# Patient Record
Sex: Female | Born: 1965 | Race: Black or African American | Hispanic: No | Marital: Married | Smoking: Current every day smoker
Health system: Southern US, Community
[De-identification: ages and names within clinical notes are randomized; demographics above are authoritative.]

## PROBLEM LIST (undated history)

## (undated) DIAGNOSIS — G4730 Sleep apnea, unspecified: Secondary | ICD-10-CM

## (undated) DIAGNOSIS — E119 Type 2 diabetes mellitus without complications: Secondary | ICD-10-CM

## (undated) DIAGNOSIS — D219 Benign neoplasm of connective and other soft tissue, unspecified: Secondary | ICD-10-CM

## (undated) DIAGNOSIS — E7800 Pure hypercholesterolemia, unspecified: Secondary | ICD-10-CM

## (undated) DIAGNOSIS — I10 Essential (primary) hypertension: Secondary | ICD-10-CM

## (undated) HISTORY — PX: UTERINE FIBROID SURGERY: SHX826

---

## 2012-08-22 ENCOUNTER — Emergency Department (HOSPITAL_BASED_OUTPATIENT_CLINIC_OR_DEPARTMENT_OTHER): Payer: Medicaid Other

## 2012-08-22 ENCOUNTER — Emergency Department (HOSPITAL_BASED_OUTPATIENT_CLINIC_OR_DEPARTMENT_OTHER)
Admission: EM | Admit: 2012-08-22 | Discharge: 2012-08-23 | Disposition: A | Discharge status: Home / Self Care | Payer: Medicaid Other | Attending: Emergency Medicine | Admitting: Emergency Medicine

## 2012-08-22 ENCOUNTER — Encounter (HOSPITAL_BASED_OUTPATIENT_CLINIC_OR_DEPARTMENT_OTHER): Payer: Self-pay | Admitting: *Deleted

## 2012-08-22 DIAGNOSIS — E119 Type 2 diabetes mellitus without complications: Secondary | ICD-10-CM | POA: Insufficient documentation

## 2012-08-22 DIAGNOSIS — I10 Essential (primary) hypertension: Secondary | ICD-10-CM | POA: Insufficient documentation

## 2012-08-22 DIAGNOSIS — D219 Benign neoplasm of connective and other soft tissue, unspecified: Secondary | ICD-10-CM

## 2012-08-22 DIAGNOSIS — Z79899 Other long term (current) drug therapy: Secondary | ICD-10-CM | POA: Insufficient documentation

## 2012-08-22 DIAGNOSIS — R300 Dysuria: Secondary | ICD-10-CM | POA: Insufficient documentation

## 2012-08-22 DIAGNOSIS — R350 Frequency of micturition: Secondary | ICD-10-CM | POA: Insufficient documentation

## 2012-08-22 DIAGNOSIS — F17200 Nicotine dependence, unspecified, uncomplicated: Secondary | ICD-10-CM | POA: Insufficient documentation

## 2012-08-22 DIAGNOSIS — Z862 Personal history of diseases of the blood and blood-forming organs and certain disorders involving the immune mechanism: Secondary | ICD-10-CM | POA: Insufficient documentation

## 2012-08-22 DIAGNOSIS — N390 Urinary tract infection, site not specified: Secondary | ICD-10-CM | POA: Insufficient documentation

## 2012-08-22 DIAGNOSIS — R109 Unspecified abdominal pain: Secondary | ICD-10-CM

## 2012-08-22 DIAGNOSIS — Z8639 Personal history of other endocrine, nutritional and metabolic disease: Secondary | ICD-10-CM | POA: Insufficient documentation

## 2012-08-22 DIAGNOSIS — D259 Leiomyoma of uterus, unspecified: Secondary | ICD-10-CM | POA: Insufficient documentation

## 2012-08-22 DIAGNOSIS — Z3202 Encounter for pregnancy test, result negative: Secondary | ICD-10-CM | POA: Insufficient documentation

## 2012-08-22 HISTORY — DX: Essential (primary) hypertension: I10

## 2012-08-22 HISTORY — DX: Type 2 diabetes mellitus without complications: E11.9

## 2012-08-22 HISTORY — DX: Pure hypercholesterolemia, unspecified: E78.00

## 2012-08-22 HISTORY — DX: Benign neoplasm of connective and other soft tissue, unspecified: D21.9

## 2012-08-22 HISTORY — DX: Sleep apnea, unspecified: G47.30

## 2012-08-22 LAB — COMPREHENSIVE METABOLIC PANEL
ALT: 10 U/L (ref 0–35)
AST: 12 U/L (ref 0–37)
Albumin: 4.2 g/dL (ref 3.5–5.2)
Alkaline Phosphatase: 63 U/L (ref 39–117)
BUN: 11 mg/dL (ref 6–23)
CO2: 27 mEq/L (ref 19–32)
Calcium: 9.8 mg/dL (ref 8.4–10.5)
Chloride: 98 mEq/L (ref 96–112)
Creatinine, Ser: 0.8 mg/dL (ref 0.50–1.10)
GFR calc Af Amer: >90 mL/min (ref >90)
GFR calc non Af Amer: 88 mL/min — ABNORMAL LOW (ref >90)
Glucose, Bld: 114 mg/dL — ABNORMAL HIGH (ref 70–99)
Potassium: 3.3 mEq/L — ABNORMAL LOW (ref 3.5–5.1)
Sodium: 138 mEq/L (ref 135–145)
Total Bilirubin: 0.3 mg/dL (ref 0.3–1.2)
Total Protein: 7.7 g/dL (ref 6.0–8.3)

## 2012-08-22 LAB — URINALYSIS, ROUTINE W REFLEX MICROSCOPIC
Ketones, ur: NEGATIVE mg/dL
Nitrite: NEGATIVE
Urobilinogen, UA: 1 mg/dL (ref 0.0–1.0)

## 2012-08-22 LAB — CBC WITH DIFFERENTIAL/PLATELET
Basophils Absolute: 0 10*3/uL (ref 0.0–0.1)
Basophils Relative: 0 % (ref 0–1)
Eosinophils Absolute: 0.2 10*3/uL (ref 0.0–0.7)
Eosinophils Relative: 2 % (ref 0–5)
HCT: 42.4 % (ref 36.0–46.0)
Hemoglobin: 14.4 g/dL (ref 12.0–15.0)
Lymphocytes Relative: 52 % — ABNORMAL HIGH (ref 12–46)
Lymphs Abs: 4.1 10*3/uL — ABNORMAL HIGH (ref 0.7–4.0)
MCH: 29.8 pg (ref 26.0–34.0)
MCHC: 34 g/dL (ref 30.0–36.0)
MCV: 87.6 fL (ref 78.0–100.0)
Monocytes Absolute: 0.5 10*3/uL (ref 0.1–1.0)
Monocytes Relative: 7 % (ref 3–12)
Neutro Abs: 3 10*3/uL (ref 1.7–7.7)
Neutrophils Relative %: 39 % — ABNORMAL LOW (ref 43–77)
Platelets: 331 10*3/uL (ref 150–400)
RBC: 4.84 MIL/uL (ref 3.87–5.11)
RDW: 15.2 % (ref 11.5–15.5)
WBC: 7.8 10*3/uL (ref 4.0–10.5)

## 2012-08-22 LAB — PREGNANCY, URINE: Preg Test, Ur: NEGATIVE

## 2012-08-22 MED ORDER — ONDANSETRON HCL 4 MG/2ML IJ SOLN
4.0000 mg | Freq: Once | INTRAMUSCULAR | Status: AC
  Administered 2012-08-22: 4 mg via INTRAVENOUS
  Filled 2012-08-22: qty 2

## 2012-08-22 MED ORDER — IOHEXOL 300 MG/ML  SOLN
50.0000 mL | Freq: Once | INTRAMUSCULAR | Status: AC | PRN
  Administered 2012-08-22: 50 mL via ORAL

## 2012-08-22 MED ORDER — HYDROMORPHONE HCL PF 1 MG/ML IJ SOLN
1.0000 mg | Freq: Once | INTRAMUSCULAR | Status: AC
  Administered 2012-08-22: 1 mg via INTRAVENOUS
  Filled 2012-08-22: qty 1

## 2012-08-22 MED ORDER — CEPHALEXIN 500 MG PO CAPS: 500.0000 mg | ORAL_CAPSULE | Freq: Four times a day (QID) | ORAL | Status: AC

## 2012-08-22 MED ORDER — IOHEXOL 300 MG/ML  SOLN
150.0000 mL | Freq: Once | INTRAMUSCULAR | Status: AC | PRN
  Administered 2012-08-22: 150 mL via INTRAVENOUS

## 2012-08-22 MED ORDER — HYDROCODONE-ACETAMINOPHEN 5-325 MG PO TABS: 1.0000 | ORAL_TABLET | Freq: Four times a day (QID) | ORAL | Status: AC | PRN

## 2012-08-22 NOTE — ED Notes (Signed)
C/o low abd pain for couple weeks- reports urinary frequency

## 2012-08-22 NOTE — ED Notes (Signed)
rx x 2 given for hydrocodone and keflex- pt has a ride

## 2012-08-22 NOTE — ED Provider Notes (Signed)
History     CSN: [id number]  Arrival date & time 08/22/12  1527   First MD Initiated Contact with Patient 08/22/12 1844      Chief Complaint  Patient presents with  . Abdominal Pain    (Consider location/radiation/quality/duration/timing/severity/associated sxs/prior treatment) HPI Patient presents emergency department with lower abdominal pain, that began 2 weeks ago.  Patient, states symptoms have gotten worse over that time frame.  Patient denies nausea, vomiting, fever, or headache, visual changes, chest pain, shortness of breath, back pain, or syncope.  Patient, states, that she's had some urinary frequency and discomfort with urination.  Patient denies vaginal bleeding or vaginal discharge.  Past Medical History  Diagnosis Date  . Diabetes mellitus without complication   . Hypertension   . Sleep apnea   . High cholesterol   . Fibroids     Past Surgical History  Procedure Laterality Date  . Uterine fibroid surgery      No family history on file.  History  Substance Use Topics  . Smoking status: Current Every Day Smoker  . Smokeless tobacco: Never Used  . Alcohol Use: No    OB History   Grav Para Term Preterm Abortions TAB SAB Ect Mult Living                  Review of Systems All other systems negative except as documented in the HPI. All pertinent positives and negatives as reviewed in the HPI. Allergies  Review of patient's allergies indicates no known allergies.  Home Medications   Current Outpatient Rx  Name  Route  Sig  Dispense  Refill  . amLODipine (NORVASC) 5 MG tablet   Oral   Take 5 mg by mouth daily.         . Exenatide (BYDUREON [place])   Subcutaneous   Inject into the skin.         . hydrochlorothiazide (HYDRODIURIL) 25 MG tablet   Oral   Take 25 mg by mouth daily.         Marland Kitchen lisinopril (PRINIVIL,ZESTRIL) 10 MG tablet   Oral   Take 10 mg by mouth daily.         . metFORMIN (GLUCOPHAGE) 500 MG tablet   Oral   Take 500 mg by  mouth 2 (two) times daily with a meal.           BP 151/78  Pulse 63  Temp(Src) 98.4 F (36.9 C)  Resp 20  SpO2 99%  LMP 08/15/2012  Physical Exam  Nursing note and vitals reviewed. Constitutional: She is oriented to person, place, and time. She appears well-developed and well-nourished. No distress.  HENT:  Head: Normocephalic and atraumatic.  Mouth/Throat: Oropharynx is clear and moist.  Eyes: Pupils are equal, round, and reactive to light.  Neck: Normal range of motion. Neck supple.  Cardiovascular: Normal rate, regular rhythm and normal heart sounds.   Pulmonary/Chest: Effort normal and breath sounds normal.  Abdominal: Soft. Bowel sounds are normal. She exhibits no distension. There is tenderness. There is no rigidity, no rebound, no guarding and negative Murphy's sign.    Genitourinary: Vagina normal. No vaginal discharge found.  Neurological: She is alert and oriented to person, place, and time.  Skin: Skin is warm and dry. No rash noted.    ED Course  Procedures (including critical care time)  Labs Reviewed  URINALYSIS, ROUTINE W REFLEX MICROSCOPIC - Abnormal; Notable for the following:    Color, Urine AMBER (*)  APPearance CLOUDY (*)    Hgb urine dipstick TRACE (*)    Bilirubin Urine SMALL (*)    Leukocytes, UA SMALL (*)    All other components within normal limits  URINE MICROSCOPIC-ADD ON - Abnormal; Notable for the following:    Squamous Epithelial / LPF MANY (*)    Bacteria, UA MANY (*)    All other components within normal limits  CBC WITH DIFFERENTIAL - Abnormal; Notable for the following:    Neutrophils Relative 39 (*)    Lymphocytes Relative 52 (*)    Lymphs Abs 4.1 (*)    All other components within normal limits  COMPREHENSIVE METABOLIC PANEL - Abnormal; Notable for the following:    Potassium 3.3 (*)    Glucose, Bld 114 (*)    GFR calc non Af Amer 88 (*)    All other components within normal limits  URINE CULTURE  PREGNANCY, URINE   Ct  Abdomen Pelvis W Contrast  08/22/2012  *RADIOLOGY REPORT*  Clinical Data: Right lower quadrant abdominal pain for 2 weeks; constipation.  CT ABDOMEN AND PELVIS WITH CONTRAST  Technique:  Multidetector CT imaging of the abdomen and pelvis was performed following the standard protocol during bolus administration of intravenous contrast.  Contrast: 150 mL of Omnipaque 300 IV contrast  Comparison: None.  Findings: The visualized lung bases are clear.  The liver and spleen are unremarkable in appearance.  The gallbladder is unremarkable; slightly increased attenuation within the gallbladder is nonspecific and may remain within normal limits. The pancreas and adrenal glands are unremarkable.  The kidneys are unremarkable in appearance.  There is no evidence of hydronephrosis.  No renal or ureteral stones are seen.  No perinephric stranding is appreciated.  No free fluid is identified.  The small bowel is unremarkable in appearance.  The stomach is within normal limits.  No acute vascular abnormalities are seen.  The appendix is normal in caliber, without evidence for appendicitis.  The colon is unremarkable in appearance, with a small amount of stool along the ascending and transverse colon.  The bladder is mildly distended and grossly unremarkable. Peripherally calcified fibroids are seen within the uterus, measuring up to 6.6 cm in size.  The ovaries are relatively symmetric; no suspicious adnexal masses are seen.  No inguinal lymphadenopathy is seen.  No acute osseous abnormalities are identified.  IMPRESSION:  1.  No acute abnormalities seen in the abdomen or pelvis. No CT findings to suggest significant constipation. 2.  Enlarged uterus, with several large peripherally calcified fibroids, measuring up to 6.6 cm in size.   Original Report Authenticated By: Tonia Ghent, M.D.      Patient be treated for UTI, based on her physical exam findings, along with her history of present illness.  CT scan does not show any  significant abnormality.  The patient has a history of fibroids of the uterus.  This could also contribute to her pain as well   MDM  MDM Reviewed: vitals and nursing note Interpretation: labs and CT scan   The patient is referred back to her primary care Dr. for further evaluation and recheck.         Carlyle Dolly, PA-C 08/22/12 2328

## 2012-08-22 NOTE — ED Notes (Signed)
Pt returned from CT °

## 2012-08-22 NOTE — ED Notes (Signed)
Pt requesting pain medication. Will update PA.

## 2012-08-23 NOTE — ED Provider Notes (Signed)
I personally performed the services described in this documentation, which was scribed in my presence. The recorded information has been reviewed and considered.   Hilario Quarry, MD 08/23/12 [phone]

## 2012-08-24 LAB — URINE CULTURE: Culture: NO GROWTH

## 2013-02-17 ENCOUNTER — Encounter: Payer: Self-pay | Admitting: Internal Medicine

## 2013-04-14 NOTE — Progress Notes (Signed)
This encounter was created in error - please disregard.

## 2013-07-16 ENCOUNTER — Encounter (HOSPITAL_BASED_OUTPATIENT_CLINIC_OR_DEPARTMENT_OTHER): Payer: Self-pay | Admitting: Emergency Medicine

## 2013-07-16 ENCOUNTER — Emergency Department (HOSPITAL_BASED_OUTPATIENT_CLINIC_OR_DEPARTMENT_OTHER)
Admission: EM | Admit: 2013-07-16 | Discharge: 2013-07-16 | Disposition: A | Discharge status: Home / Self Care | Payer: Medicaid Other | Attending: Emergency Medicine | Admitting: Emergency Medicine

## 2013-07-16 ENCOUNTER — Emergency Department (HOSPITAL_BASED_OUTPATIENT_CLINIC_OR_DEPARTMENT_OTHER): Payer: Medicaid Other

## 2013-07-16 DIAGNOSIS — Z9889 Other specified postprocedural states: Secondary | ICD-10-CM | POA: Insufficient documentation

## 2013-07-16 DIAGNOSIS — Z8669 Personal history of other diseases of the nervous system and sense organs: Secondary | ICD-10-CM | POA: Insufficient documentation

## 2013-07-16 DIAGNOSIS — Z79899 Other long term (current) drug therapy: Secondary | ICD-10-CM | POA: Insufficient documentation

## 2013-07-16 DIAGNOSIS — Z792 Long term (current) use of antibiotics: Secondary | ICD-10-CM | POA: Insufficient documentation

## 2013-07-16 DIAGNOSIS — E119 Type 2 diabetes mellitus without complications: Secondary | ICD-10-CM | POA: Insufficient documentation

## 2013-07-16 DIAGNOSIS — F17200 Nicotine dependence, unspecified, uncomplicated: Secondary | ICD-10-CM | POA: Insufficient documentation

## 2013-07-16 DIAGNOSIS — D259 Leiomyoma of uterus, unspecified: Secondary | ICD-10-CM | POA: Insufficient documentation

## 2013-07-16 DIAGNOSIS — Z3202 Encounter for pregnancy test, result negative: Secondary | ICD-10-CM | POA: Insufficient documentation

## 2013-07-16 DIAGNOSIS — I10 Essential (primary) hypertension: Secondary | ICD-10-CM | POA: Insufficient documentation

## 2013-07-16 LAB — URINE MICROSCOPIC-ADD ON

## 2013-07-16 LAB — COMPREHENSIVE METABOLIC PANEL
ALBUMIN: 3.9 g/dL (ref 3.5–5.2)
ALK PHOS: 73 U/L (ref 39–117)
ALT: 13 U/L (ref 0–35)
AST: 10 U/L (ref 0–37)
BILIRUBIN TOTAL: 0.3 mg/dL (ref 0.3–1.2)
BUN: 8 mg/dL (ref 6–23)
CHLORIDE: 98 meq/L (ref 96–112)
CO2: 23 mEq/L (ref 19–32)
Calcium: 9.1 mg/dL (ref 8.4–10.5)
Creatinine, Ser: 0.7 mg/dL (ref 0.50–1.10)
GFR calc Af Amer: >90 mL/min (ref >90)
GFR calc non Af Amer: >90 mL/min (ref >90)
Glucose, Bld: 210 mg/dL — ABNORMAL HIGH (ref 70–99)
POTASSIUM: 3.7 meq/L (ref 3.7–5.3)
SODIUM: 136 meq/L — AB (ref 137–147)
Total Protein: 7.2 g/dL (ref 6.0–8.3)

## 2013-07-16 LAB — CBC WITH DIFFERENTIAL/PLATELET
BASOS PCT: 0 % (ref 0–1)
Basophils Absolute: 0 10*3/uL (ref 0.0–0.1)
Eosinophils Absolute: 0.1 10*3/uL (ref 0.0–0.7)
Eosinophils Relative: 1 % (ref 0–5)
HCT: 39.7 % (ref 36.0–46.0)
HEMOGLOBIN: 13.2 g/dL (ref 12.0–15.0)
LYMPHS ABS: 2.4 10*3/uL (ref 0.7–4.0)
Lymphocytes Relative: 23 % (ref 12–46)
MCH: 29.4 pg (ref 26.0–34.0)
MCHC: 33.2 g/dL (ref 30.0–36.0)
MCV: 88.4 fL (ref 78.0–100.0)
MONOS PCT: 6 % (ref 3–12)
Monocytes Absolute: 0.7 10*3/uL (ref 0.1–1.0)
NEUTROS ABS: 7.2 10*3/uL (ref 1.7–7.7)
NEUTROS PCT: 69 % (ref 43–77)
Platelets: 333 10*3/uL (ref 150–400)
RBC: 4.49 MIL/uL (ref 3.87–5.11)
RDW: 14.6 % (ref 11.5–15.5)
WBC: 10.4 10*3/uL (ref 4.0–10.5)

## 2013-07-16 LAB — URINALYSIS, ROUTINE W REFLEX MICROSCOPIC
Glucose, UA: 250 mg/dL — AB
KETONES UR: 15 mg/dL — AB
NITRITE: POSITIVE — AB
Protein, ur: NEGATIVE mg/dL
Specific Gravity, Urine: 1.029 (ref 1.005–1.030)
UROBILINOGEN UA: 1 mg/dL (ref 0.0–1.0)
pH: 5.5 (ref 5.0–8.0)

## 2013-07-16 LAB — GC/CHLAMYDIA PROBE AMP
CT PROBE, AMP APTIMA: NEGATIVE
GC PROBE AMP APTIMA: NEGATIVE

## 2013-07-16 LAB — WET PREP, GENITAL
Clue Cells Wet Prep HPF POC: NONE SEEN
Trich, Wet Prep: NONE SEEN
Yeast Wet Prep HPF POC: NONE SEEN

## 2013-07-16 LAB — PREGNANCY, URINE: Preg Test, Ur: NEGATIVE

## 2013-07-16 MED ORDER — IOHEXOL 300 MG/ML  SOLN
50.0000 mL | Freq: Once | INTRAMUSCULAR | Status: AC | PRN
  Administered 2013-07-16: 50 mL via ORAL

## 2013-07-16 MED ORDER — FENTANYL CITRATE 0.05 MG/ML IJ SOLN
100.0000 ug | Freq: Once | INTRAMUSCULAR | Status: AC
  Administered 2013-07-16: 100 ug via INTRAVENOUS
  Filled 2013-07-16: qty 2

## 2013-07-16 MED ORDER — SODIUM CHLORIDE 0.9 % IV SOLN
INTRAVENOUS | Status: DC
  Administered 2013-07-16: 04:00:00 via INTRAVENOUS

## 2013-07-16 MED ORDER — OXYCODONE-ACETAMINOPHEN 5-325 MG PO TABS: ORAL_TABLET | ORAL | Status: AC

## 2013-07-16 NOTE — ED Notes (Signed)
Patient transported to CT 

## 2013-07-16 NOTE — ED Notes (Signed)
C/o pelvic pain since Tuesday, denies vaginal bleeding or fever. Pt has hx of fibroids in the past.

## 2013-07-16 NOTE — ED Notes (Signed)
MD at bedside. 

## 2013-07-16 NOTE — ED Notes (Signed)
Pt c/o increased pain, MD made aware and new orders rec'd.

## 2013-07-16 NOTE — ED Provider Notes (Addendum)
CSN: [id number]     Arrival date & time 07/16/13  [id number] History   First MD Initiated Contact with Patient 07/16/13 0350     Chief Complaint  Patient presents with  . Pelvic Pain   (Consider location/radiation/quality/duration/timing/severity/associated sxs/prior Treatment) HPI This is a 48 year old female with a history of uterine fibroids. She is here with right lower quadrant abdominal pain for the past 4 days but acutely worsened this morning. She describes the pain is sharp. It is moderate to severe. It is worse with movement or palpation. She has not noticed any associated vaginal bleeding, vaginal discharge, dysuria or hematuria. She has not been nauseated, vomiting or having diarrhea. She has been afebrile. The pain is reminiscent of previous episodes of pain from her uterine fibroids.  Past Medical History  Diagnosis Date  . Diabetes mellitus without complication   . Hypertension   . Sleep apnea   . High cholesterol   . Fibroids    Past Surgical History  Procedure Laterality Date  . Uterine fibroid surgery     History reviewed. No pertinent family history. History  Substance Use Topics  . Smoking status: Current Every Day Smoker  . Smokeless tobacco: Never Used  . Alcohol Use: No   OB History   Grav Para Term Preterm Abortions TAB SAB Ect Mult Living                 Review of Systems  All other systems reviewed and are negative.    Allergies  Review of patient's allergies indicates no known allergies.  Home Medications   Current Outpatient Rx  Name  Route  Sig  Dispense  Refill  . amLODipine (NORVASC) 5 MG tablet   Oral   Take 5 mg by mouth daily.         . hydrochlorothiazide (HYDRODIURIL) 25 MG tablet   Oral   Take 25 mg by mouth daily.         Marland Kitchen HYDROcodone-acetaminophen (NORCO/VICODIN) 5-325 MG per tablet   Oral   Take 1 tablet by mouth every 6 (six) hours as needed for pain.   15 tablet   0   . lisinopril (PRINIVIL,ZESTRIL) 10 MG tablet    Oral   Take 10 mg by mouth daily.         . metFORMIN (GLUCOPHAGE) 500 MG tablet   Oral   Take 500 mg by mouth 2 (two) times daily with a meal.         . cephALEXin (KEFLEX) 500 MG capsule   Oral   Take 1 capsule (500 mg total) by mouth 4 (four) times daily.   28 capsule   0   . Exenatide (BYDUREON [place])   Subcutaneous   Inject into the skin.          BP 149/93  Pulse 79  Temp(Src) 98.4 F (36.9 C) (Oral)  Resp 18  Ht 5\' 4"  (1.626 m)  Wt 358 lb (162.388 kg)  BMI 61.42 kg/m2  SpO2 100%  LMP 07/01/2013  Physical Exam General: Well-developed, obese female in no acute distress; appearance consistent with age of record HENT: normocephalic; atraumatic Eyes: pupils equal, round and reactive to light; extraocular muscles intact Neck: supple Heart: regular rate and rhythm Lungs: clear to auscultation bilaterally Abdomen: soft; obese; right lower quadrant tenderness; bowel sounds present GU: Normal external genitalia; white vaginal discharge; no vaginal bleeding; no cervical inflammation; uterus enlarged; no cervical motion tenderness; no adnexal tenderness but examination limited by obesity Extremities:  No deformity; full range of motion Neurologic: Awake, alert and oriented; motor function intact in all extremities and symmetric; no facial droop Skin: Warm and dry Psychiatric: Normal mood and affect    ED Course  Procedures (including critical care time)    MDM   Nursing notes and vitals signs, including pulse oximetry, reviewed.  Summary of this visit's results, reviewed by myself:  Labs:  Results for orders placed during the hospital encounter of 07/16/13 (from the past 24 hour(s))  URINALYSIS, ROUTINE W REFLEX MICROSCOPIC     Status: Abnormal   Collection Time    07/16/13  3:45 AM      Result Value Range   Color, Urine ORANGE (*) YELLOW   APPearance CLOUDY (*) CLEAR   Specific Gravity, Urine 1.029  1.005 - 1.030   pH 5.5  5.0 - 8.0   Glucose, UA 250  (*) NEGATIVE mg/dL   Hgb urine dipstick SMALL (*) NEGATIVE   Bilirubin Urine SMALL (*) NEGATIVE   Ketones, ur 15 (*) NEGATIVE mg/dL   Protein, ur NEGATIVE  NEGATIVE mg/dL   Urobilinogen, UA 1.0  0.0 - 1.0 mg/dL   Nitrite POSITIVE (*) NEGATIVE   Leukocytes, UA MODERATE (*) NEGATIVE  PREGNANCY, URINE     Status: None   Collection Time    07/16/13  3:45 AM      Result Value Range   Preg Test, Ur NEGATIVE  NEGATIVE  URINE MICROSCOPIC-ADD ON     Status: Abnormal   Collection Time    07/16/13  3:45 AM      Result Value Range   Squamous Epithelial / LPF RARE  RARE   WBC, UA 3-6  <3 WBC/hpf   RBC / HPF 3-6  <3 RBC/hpf   Bacteria, UA FEW (*) RARE   Urine-Other MUCOUS PRESENT    WET PREP, GENITAL     Status: Abnormal   Collection Time    07/16/13  4:00 AM      Result Value Range   Yeast Wet Prep HPF POC NONE SEEN  NONE SEEN   Trich, Wet Prep NONE SEEN  NONE SEEN   Clue Cells Wet Prep HPF POC NONE SEEN  NONE SEEN   WBC, Wet Prep HPF POC FEW (*) NONE SEEN  CBC WITH DIFFERENTIAL     Status: None   Collection Time    07/16/13  4:10 AM      Result Value Range   WBC 10.4  4.0 - 10.5 K/uL   RBC 4.49  3.87 - 5.11 MIL/uL   Hemoglobin 13.2  12.0 - 15.0 g/dL   HCT 39.7  36.0 - 46.0 %   MCV 88.4  78.0 - 100.0 fL   MCH 29.4  26.0 - 34.0 pg   MCHC 33.2  30.0 - 36.0 g/dL   RDW 14.6  11.5 - 15.5 %   Platelets 333  150 - 400 K/uL   Neutrophils Relative % 69  43 - 77 %   Neutro Abs 7.2  1.7 - 7.7 K/uL   Lymphocytes Relative 23  12 - 46 %   Lymphs Abs 2.4  0.7 - 4.0 K/uL   Monocytes Relative 6  3 - 12 %   Monocytes Absolute 0.7  0.1 - 1.0 K/uL   Eosinophils Relative 1  0 - 5 %   Eosinophils Absolute 0.1  0.0 - 0.7 K/uL   Basophils Relative 0  0 - 1 %   Basophils Absolute 0.0  0.0 - 0.1 K/uL  COMPREHENSIVE METABOLIC PANEL     Status: Abnormal   Collection Time    07/16/13  4:10 AM      Result Value Range   Sodium 136 (*) 137 - 147 mEq/L   Potassium 3.7  3.7 - 5.3 mEq/L   Chloride 98  96  - 112 mEq/L   CO2 23  19 - 32 mEq/L   Glucose, Bld 210 (*) 70 - 99 mg/dL   BUN 8  6 - 23 mg/dL   Creatinine, Ser 0.70  0.50 - 1.10 mg/dL   Calcium 9.1  8.4 - 10.5 mg/dL   Total Protein 7.2  6.0 - 8.3 g/dL   Albumin 3.9  3.5 - 5.2 g/dL   AST 10  0 - 37 U/L   ALT 13  0 - 35 U/L   Alkaline Phosphatase 73  39 - 117 U/L   Total Bilirubin 0.3  0.3 - 1.2 mg/dL   GFR calc non Af Amer >90  >90 mL/min   GFR calc Af Amer >90  >90 mL/min    Imaging Studies: Ct Abdomen Pelvis Wo Contrast  07/16/2013   CLINICAL DATA:  Right lower quadrant pain, pelvic pain.  EXAM: CT ABDOMEN AND PELVIS WITHOUT CONTRAST  TECHNIQUE: Multidetector CT imaging of the abdomen and pelvis was performed following the standard protocol without intravenous contrast.  COMPARISON:  Prior CT from 08/22/2012  FINDINGS: The visualized lung bases are clear.  The liver is within normal limits. Gallbladder is unremarkable. No biliary ductal dilatation. The spleen, adrenal glands, and pancreas demonstrate a normal unenhanced appearance.  The kidneys are symmetric in size without evidence nephrolithiasis or hydronephrosis. No stones seen along the course of either renal collecting system. No hydroureter.  There is no evidence of bowel obstruction. Appendix is well visualized in the right lower quadrant and is of normal caliber and appearance without associated inflammatory changes to suggest acute appendicitis. No inflammatory fat stranding or abnormal wall thickening seen about the bowels. Small fat containing paraumbilical hernia noted.  The bladder is within normal limits.  The uterus is enlarged with multiple peripherally calcified fibroids again seen. Again, the largest of these measures approximately 6.8 cm and maximal diameter. Ovaries are within normal limits.  No free air or fluid. No enlarged intra-abdominal pelvic lymph nodes.  Osseous structures are within normal limits. No focal lytic or blastic osseous lesions identified.  IMPRESSION:  1. No CT evidence of acute intra-abdominal or pelvic process. Normal appendix. 2. Enlarged fibroid uterus, similar to prior exam.   Electronically Signed   By: Jeannine Boga M.D.   On: 07/16/2013 06:19   6:35 AM Suspect pain is related to patient's chronic fibroids. An infarcted fibroid could be causing ischemic pain. She does have an appointment 07/29/13 with a new OB/GYN, Dr. Joya Gaskins on [address] in [place]. She was advised to discuss the possibility of an elective hysterectomy at that time.     Wynetta Fines, MD 07/16/13 2751  Wynetta Fines, MD 07/16/13 [phone]

## 2013-07-17 LAB — URINE CULTURE

## 2015-03-24 IMAGING — CT CT ABD-PELV W/O CM
2 of 4 series · 17 of 46 positions shown, 19 images · non-contrast
Comparison: Prior CT from 08/22/2012

CLINICAL DATA: Right lower quadrant pain, pelvic pain.

EXAM:
CT ABDOMEN AND PELVIS WITHOUT CONTRAST
TECHNIQUE: Multidetector CT imaging of the abdomen and pelvis was performed
following the standard protocol without intravenous contrast.

[Series 2: abd/pelvis 5.0 b31f · axial · 0.98mm/px · z∈[+818,+1248]mm · 14 of 96 slices shown, 16 images]
[im 5/96  soft-tissue]
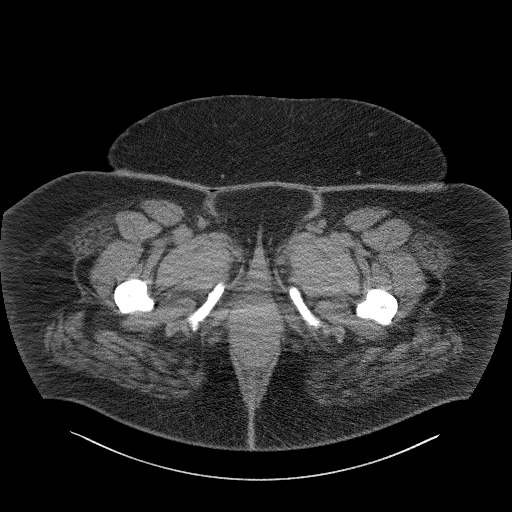
[im 5/96  bone]
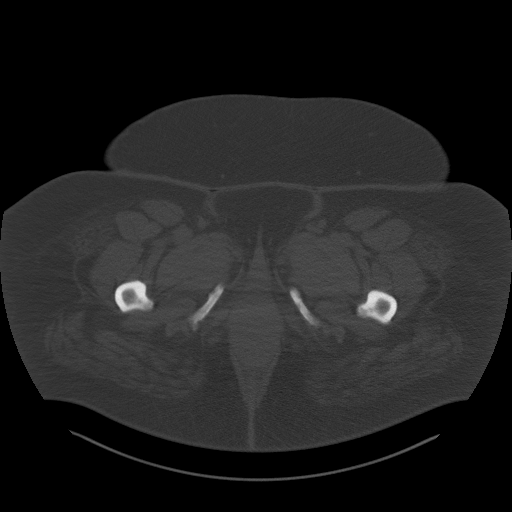
[im 13/96  soft-tissue]
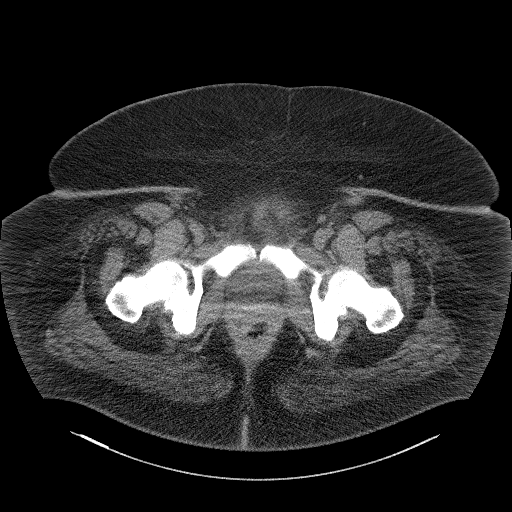
[im 17/96  soft-tissue]
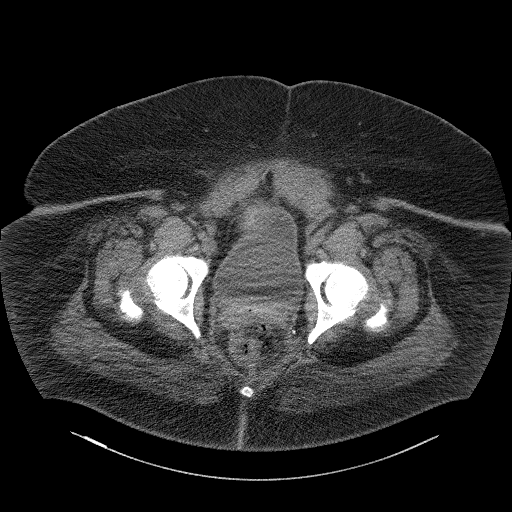
[im 25/96  soft-tissue]
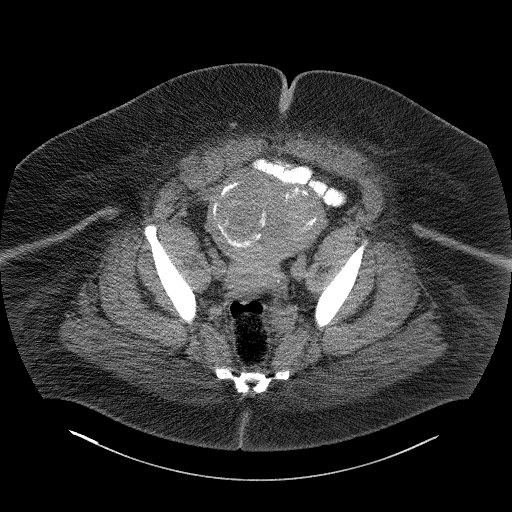
[im 34/96  soft-tissue]
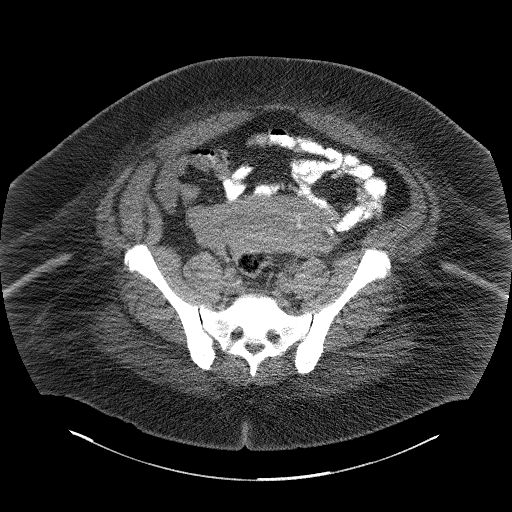
[im 38/96  soft-tissue]
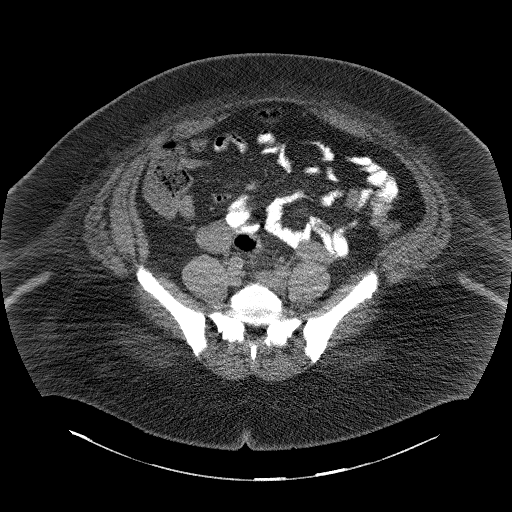
[im 46/96  soft-tissue]
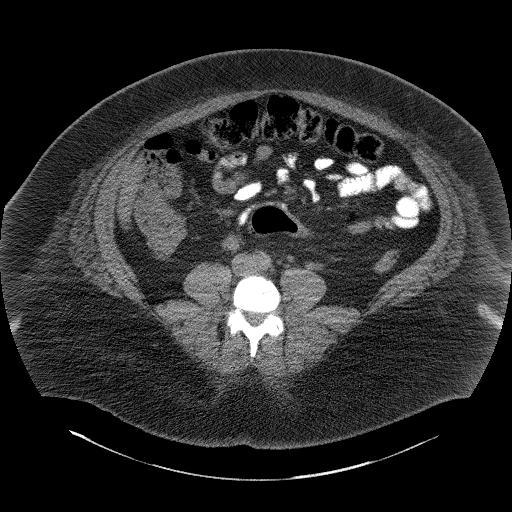
[im 50/96  soft-tissue]
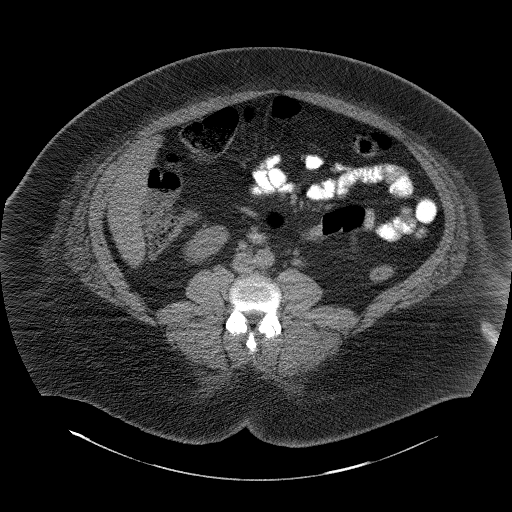
[im 58/96  soft-tissue]
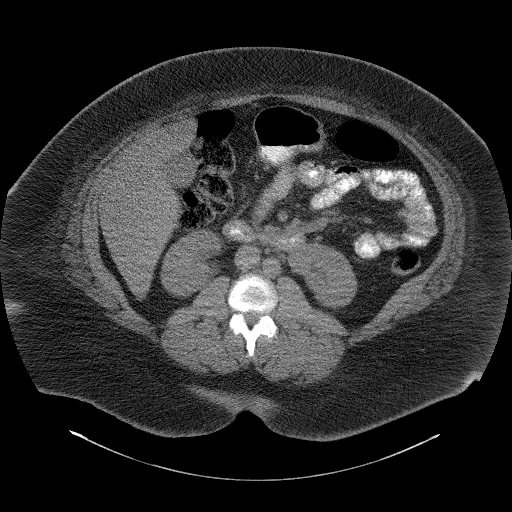
[im 58/96  bone]
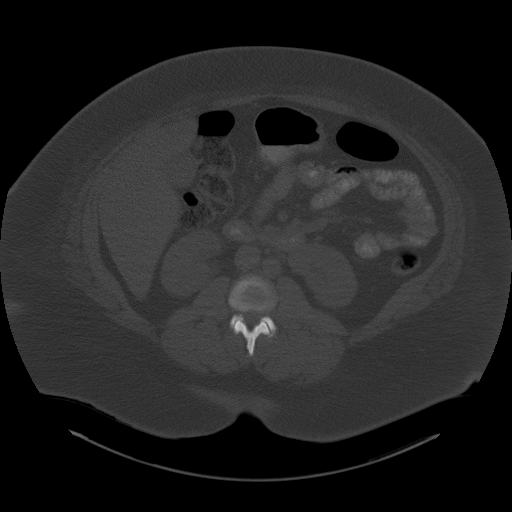
[im 62/96  soft-tissue]
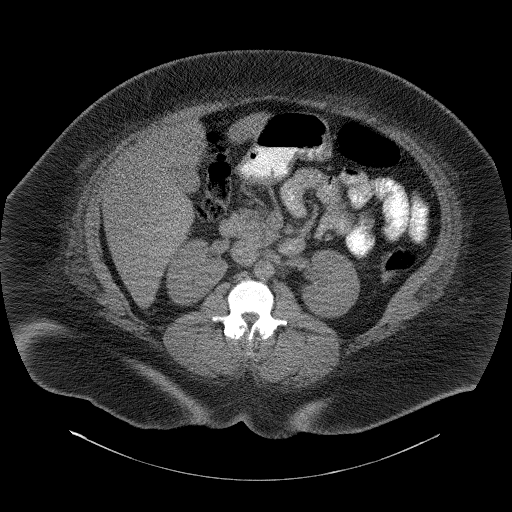
[im 71/96  soft-tissue]
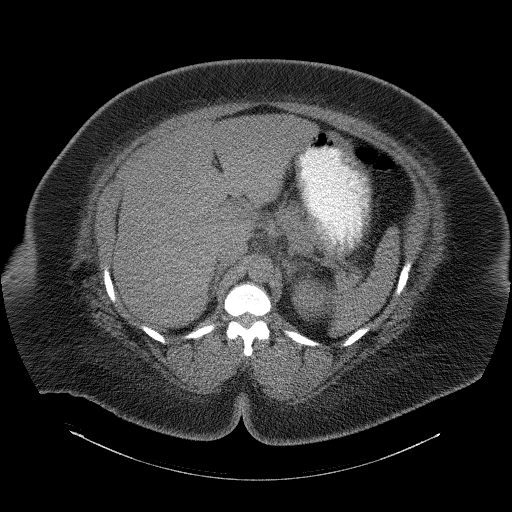
[im 79/96  soft-tissue]
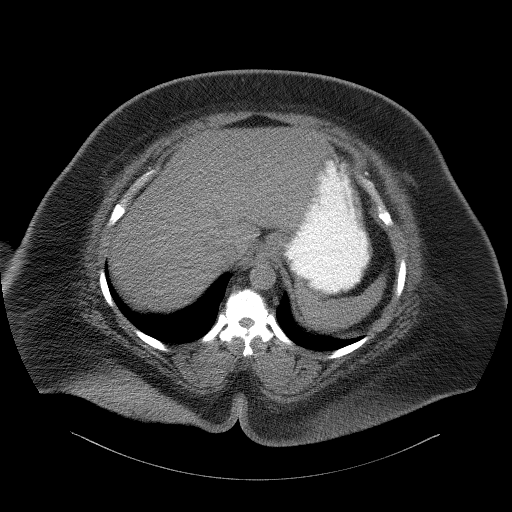
[im 83/96  soft-tissue]
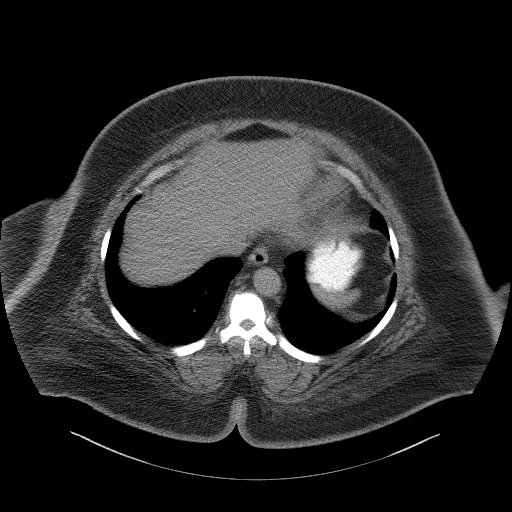
[im 91/96  soft-tissue]
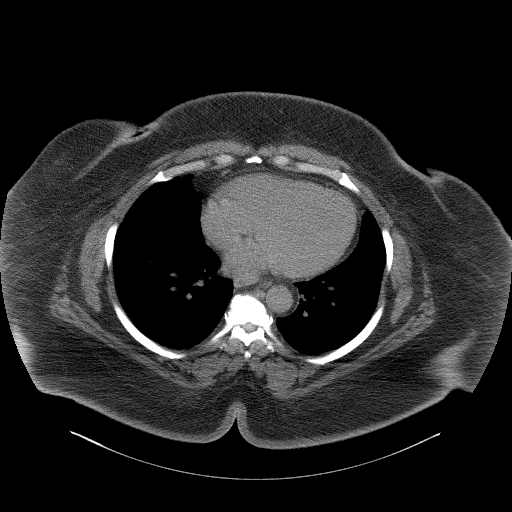

[Series 5: abd/pelvis 3.0 coronal · coronal · 0.91mm/px · 3 of 123 slices shown]
[im 41/123  soft-tissue]
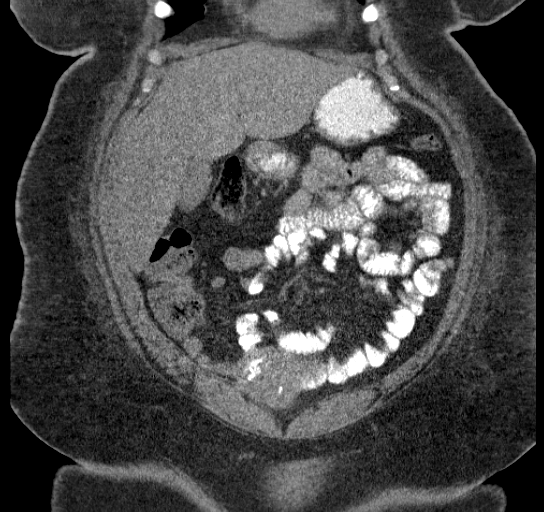
[im 55/123  soft-tissue]
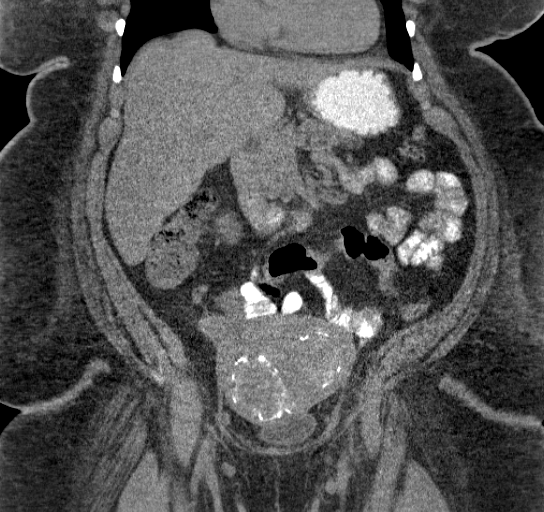
[im 68/123  soft-tissue]
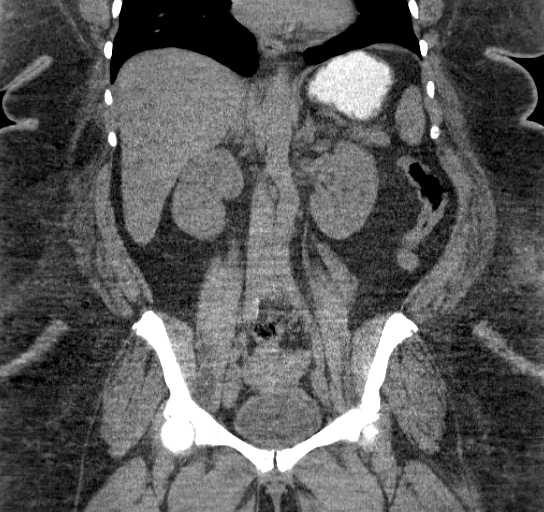

[17 of 46 positions shown; findings below may reference images not displayed]

FINDINGS: The visualized lung bases are clear.

The liver is within normal limits. Gallbladder is unremarkable. No
biliary ductal dilatation. The spleen, adrenal glands, and pancreas
demonstrate a normal unenhanced appearance.

The kidneys are symmetric in size without evidence nephrolithiasis
or hydronephrosis. No stones seen along the course of either renal
collecting system. No hydroureter.

There is no evidence of bowel obstruction. Appendix is well
visualized in the right lower quadrant and is of normal caliber and
appearance without associated inflammatory changes to suggest acute
appendicitis. No inflammatory fat stranding or abnormal wall
thickening seen about the bowels. Small fat containing paraumbilical
hernia noted.

The bladder is within normal limits.

The uterus is enlarged with multiple peripherally calcified fibroids
again seen. Again, the largest of these measures approximately
cm and maximal diameter. Ovaries are within normal limits.

No free air or fluid. No enlarged intra-abdominal pelvic lymph
nodes.

Osseous structures are within normal limits. No focal lytic or
blastic osseous lesions identified.
IMPRESSION: 1. No CT evidence of acute intra-abdominal or pelvic process. Normal
appendix.
2. Enlarged fibroid uterus, similar to prior exam.

## 2016-12-08 ENCOUNTER — Encounter (HOSPITAL_BASED_OUTPATIENT_CLINIC_OR_DEPARTMENT_OTHER): Payer: Self-pay | Admitting: Emergency Medicine

## 2016-12-08 ENCOUNTER — Emergency Department (HOSPITAL_BASED_OUTPATIENT_CLINIC_OR_DEPARTMENT_OTHER): Payer: Medicaid Other

## 2016-12-08 ENCOUNTER — Emergency Department (HOSPITAL_BASED_OUTPATIENT_CLINIC_OR_DEPARTMENT_OTHER)
Admission: EM | Admit: 2016-12-08 | Discharge: 2016-12-08 | Disposition: A | Discharge status: Home / Self Care | Payer: Medicaid Other | Attending: Emergency Medicine | Admitting: Emergency Medicine

## 2016-12-08 DIAGNOSIS — I10 Essential (primary) hypertension: Secondary | ICD-10-CM | POA: Diagnosis not present

## 2016-12-08 DIAGNOSIS — Z79899 Other long term (current) drug therapy: Secondary | ICD-10-CM | POA: Diagnosis not present

## 2016-12-08 DIAGNOSIS — E119 Type 2 diabetes mellitus without complications: Secondary | ICD-10-CM | POA: Diagnosis not present

## 2016-12-08 DIAGNOSIS — Z7984 Long term (current) use of oral hypoglycemic drugs: Secondary | ICD-10-CM | POA: Diagnosis not present

## 2016-12-08 DIAGNOSIS — R1012 Left upper quadrant pain: Secondary | ICD-10-CM | POA: Diagnosis present

## 2016-12-08 DIAGNOSIS — R1013 Epigastric pain: Secondary | ICD-10-CM | POA: Diagnosis not present

## 2016-12-08 DIAGNOSIS — K2980 Duodenitis without bleeding: Secondary | ICD-10-CM | POA: Diagnosis not present

## 2016-12-08 DIAGNOSIS — F17200 Nicotine dependence, unspecified, uncomplicated: Secondary | ICD-10-CM | POA: Insufficient documentation

## 2016-12-08 LAB — CBC
HCT: 41.1 % (ref 36.0–46.0)
Hemoglobin: 14 g/dL (ref 12.0–15.0)
MCH: 29.4 pg (ref 26.0–34.0)
MCHC: 34.1 g/dL (ref 30.0–36.0)
MCV: 86.3 fL (ref 78.0–100.0)
PLATELETS: 386 10*3/uL (ref 150–400)
RBC: 4.76 MIL/uL (ref 3.87–5.11)
RDW: 14.6 % (ref 11.5–15.5)
WBC: 7.8 10*3/uL (ref 4.0–10.5)

## 2016-12-08 LAB — URINALYSIS, ROUTINE W REFLEX MICROSCOPIC
Bilirubin Urine: NEGATIVE
Glucose, UA: 100 mg/dL — AB
Ketones, ur: NEGATIVE mg/dL
Leukocytes, UA: NEGATIVE
Nitrite: NEGATIVE
PROTEIN: NEGATIVE mg/dL
Specific Gravity, Urine: 1.029 (ref 1.005–1.030)
pH: 5.5 (ref 5.0–8.0)

## 2016-12-08 LAB — COMPREHENSIVE METABOLIC PANEL
ALT: 11 U/L — AB (ref 14–54)
AST: 14 U/L — ABNORMAL LOW (ref 15–41)
Albumin: 3.8 g/dL (ref 3.5–5.0)
Alkaline Phosphatase: 83 U/L (ref 38–126)
Anion gap: 10 (ref 5–15)
BUN: 11 mg/dL (ref 6–20)
CALCIUM: 9.1 mg/dL (ref 8.9–10.3)
CHLORIDE: 104 mmol/L (ref 101–111)
CO2: 23 mmol/L (ref 22–32)
CREATININE: 0.68 mg/dL (ref 0.44–1.00)
GFR calc non Af Amer: >60 mL/min (ref >60)
Glucose, Bld: 174 mg/dL — ABNORMAL HIGH (ref 65–99)
Potassium: 3.7 mmol/L (ref 3.5–5.1)
Sodium: 137 mmol/L (ref 135–145)
Total Bilirubin: 0.4 mg/dL (ref 0.3–1.2)
Total Protein: 6.9 g/dL (ref 6.5–8.1)

## 2016-12-08 LAB — URINALYSIS, MICROSCOPIC (REFLEX)

## 2016-12-08 LAB — LIPASE, BLOOD: LIPASE: 57 U/L — AB (ref 11–51)

## 2016-12-08 MED ORDER — FAMOTIDINE 20 MG PO TABS: 20.0000 mg | ORAL_TABLET | Freq: Two times a day (BID) | ORAL | 0 refills | Status: AC

## 2016-12-08 MED ORDER — HYDROMORPHONE HCL 1 MG/ML IJ SOLN
1.0000 mg | Freq: Once | INTRAMUSCULAR | Status: AC
  Administered 2016-12-08: 1 mg via INTRAVENOUS
  Filled 2016-12-08: qty 1

## 2016-12-08 MED ORDER — OMEPRAZOLE 20 MG PO CPDR: 20.0000 mg | DELAYED_RELEASE_CAPSULE | Freq: Every day | ORAL | 0 refills | Status: AC

## 2016-12-08 MED ORDER — IOPAMIDOL (ISOVUE-300) INJECTION 61%
100.0000 mL | Freq: Once | INTRAVENOUS | Status: AC | PRN
  Administered 2016-12-08: 100 mL via INTRAVENOUS

## 2016-12-08 MED ORDER — ONDANSETRON HCL 4 MG/2ML IJ SOLN
4.0000 mg | Freq: Once | INTRAMUSCULAR | Status: AC
  Administered 2016-12-08: 4 mg via INTRAVENOUS
  Filled 2016-12-08: qty 2

## 2016-12-08 NOTE — ED Triage Notes (Signed)
Pt reports abd pain started 1 week, sharp and dull pain , also report bilateral flank pain. denies urinary symptoms, no Hx kidney stones yet reports Hx uterus  fibroids. denies NV yet reports constipation x 4 days.

## 2016-12-08 NOTE — Discharge Instructions (Signed)
Please read and follow all provided instructions.  Your diagnoses today include:  1. Duodenitis   2. Epigastric abdominal pain     Tests performed today include:  Blood counts and electrolytes  Blood tests to check liver and kidney function  Blood tests to check pancreas function - normal  Urine test to look for infection - no infection  CT scan - Shows inflammation of the small intestine and head of the pancreas  Vital signs. See below for your results today.   Medications prescribed:   Omeprazole (Prilosec) - stomach acid reducer  This medication can be found over-the-counter   Pepcid (famotidine) - antihistamine  You can find this medication over-the-counter.   DO NOT exceed:   20mg  Pepcid every 12 hours  Take any prescribed medications only as directed.  Home care instructions:   Follow any educational materials contained in this packet.  Follow-up instructions: Call the stomach doctor (gastroenterologist) tomorrow for an appointment for further evaluation of your stomach pain and symptoms.  Call your primary care doctor tomorrow and tell them that you had a CT scan showing inflammation of the small intestine around the head of the pancreas, but the pancreas test was normal. Ask them if you should continue your weekly injection for diabetes.   Return instructions:  SEEK IMMEDIATE MEDICAL ATTENTION IF:  The pain does not go away or becomes severe   A temperature above 101F develops   Repeated vomiting occurs (multiple episodes)   The pain becomes localized to portions of the abdomen. The right side could possibly be appendicitis. In an adult, the left lower portion of the abdomen could be colitis or diverticulitis.   Blood is being passed in stools or vomit (bright red or black tarry stools)   You develop chest pain, difficulty breathing, dizziness or fainting, or become confused, poorly responsive, or inconsolable (young children)  If you have any  other emergent concerns regarding your health  Additional Information: Abdominal (belly) pain can be caused by many things. Your caregiver performed an examination and possibly ordered blood/urine tests and imaging (CT scan, x-rays, ultrasound). Many cases can be observed and treated at home after initial evaluation in the emergency department. Even though you are being discharged home, abdominal pain can be unpredictable. Therefore, you need a repeated exam if your pain does not resolve, returns, or worsens. Most patients with abdominal pain don't have to be admitted to the hospital or have surgery, but serious problems like appendicitis and gallbladder attacks can start out as nonspecific pain. Many abdominal conditions cannot be diagnosed in one visit, so follow-up evaluations are very important.  Your vital signs today were: BP (!) 150/86 (BP Location: Right Arm) Comment: Pt hasn't taken BP meds   Pulse 60    Temp 98 F (36.7 C) (Oral)    Resp 20    Ht 5\' 3"  (1.6 m)    Wt (!) 156 kg (343 lb 14.7 oz)    LMP 12/08/2016    SpO2 98%    BMI 60.92 kg/m  If your blood pressure (bp) was elevated above 135/85 this visit, please have this repeated by your doctor within one month. --------------

## 2016-12-08 NOTE — ED Provider Notes (Signed)
[facility] Provider Note   CSN: [id number] Arrival date & time: 12/08/16  0840     History   Chief Complaint Chief Complaint  Patient presents with  . Abdominal Pain  . Constipation    HPI Rachel Turner is a 51 y.o. female.  Patient with history of diabetes, uterine fibroids -- presents with complaint of abdominal pain and constipation ongoing over the past 1 week. Pain is in the left upper quadrant and scrubbed as cramping. It is worse with palpation. Pain was intermittent at onset but has become more severe and persistent. No chest pain or shortness of breath. No vomiting or diarrhea. No urinary symptoms. Patient has not had pain like this in the past. Patient has chronic back pain for which she takes Vicodin 10-325mg  daily. Denies heavy NSAID or ETOH use. The onset of this condition was acute.        Past Medical History:  Diagnosis Date  . Diabetes mellitus without complication ([place])   . Fibroids   . High cholesterol   . Hypertension   . Sleep apnea     There are no active problems to display for this patient.   Past Surgical History:  Procedure Laterality Date  . UTERINE FIBROID SURGERY      OB History    No data available       Home Medications    Prior to Admission medications   Medication Sig Start Date End Date Taking? Authorizing Provider  amLODipine (NORVASC) 5 MG tablet Take 5 mg by mouth daily.   Yes [provider]  Exenatide (BYDUREON [place]) Inject into the skin.   Yes [provider]  hydrochlorothiazide (HYDRODIURIL) 25 MG tablet Take 25 mg by mouth daily.   Yes [provider]  HYDROcodone-acetaminophen (NORCO/VICODIN) 5-325 MG per tablet Take 1 tablet by mouth every 6 (six) hours as needed for pain. 08/22/12  Yes Lawyer, Harrell Gave, PA-C  lisinopril (PRINIVIL,ZESTRIL) 10 MG tablet Take 10 mg by mouth daily.   Yes [provider]  metFORMIN (GLUCOPHAGE) 500 MG tablet Take 500 mg by mouth 2 (two)  times daily with a meal.   Yes [provider]  cephALEXin (KEFLEX) 500 MG capsule Take 1 capsule (500 mg total) by mouth 4 (four) times daily. 08/22/12   Lawyer, Harrell Gave, PA-C  oxyCODONE-acetaminophen (PERCOCET/ROXICET) 5-325 MG per tablet Take 1-2 tablets every 6 hours as needed for pain. 07/16/13   Molpus, Jenny Reichmann, MD    Family History History reviewed. No pertinent family history.  Social History Social History  Substance Use Topics  . Smoking status: Current Every Day Smoker  . Smokeless tobacco: Never Used  . Alcohol use No     Allergies   Patient has no known allergies.   Review of Systems Review of Systems  Constitutional: Negative for fever.  HENT: Negative for rhinorrhea and sore throat.   Eyes: Negative for redness.  Respiratory: Negative for cough.   Cardiovascular: Negative for chest pain.  Gastrointestinal: Positive for abdominal pain and constipation. Negative for diarrhea, nausea and vomiting.  Genitourinary: Negative for dysuria.  Musculoskeletal: Negative for myalgias.  Skin: Negative for rash.  Neurological: Negative for headaches.     Physical Exam Updated Vital Signs BP (!) 150/86 (BP Location: Right Arm) Comment: Pt hasn't taken BP meds  Pulse 60   Temp 98 F (36.7 C) (Oral)   Resp 20   Ht 5\' 3"  (1.6 m)   Wt (!) 156 kg (343 lb 14.7 oz)  LMP 12/08/2016   SpO2 98%   BMI 60.92 kg/m   Physical Exam  Constitutional: She appears well-developed and well-nourished.  HENT:  Head: Normocephalic and atraumatic.  Eyes: Conjunctivae are normal. Right eye exhibits no discharge. Left eye exhibits no discharge.  Neck: Normal range of motion. Neck supple.  Cardiovascular: Normal rate, regular rhythm and normal heart sounds.   Pulmonary/Chest: Effort normal and breath sounds normal.  Abdominal: Soft. There is tenderness (moderate LUQ). There is no rebound and no guarding.  Neurological: She is alert.  Skin: Skin is warm and dry.  Psychiatric:  She has a normal mood and affect.  Nursing note and vitals reviewed.    ED Treatments / Results  Labs (all labs ordered are listed, but only abnormal results are displayed) Labs Reviewed  LIPASE, BLOOD - Abnormal; Notable for the following:       Result Value   Lipase 57 (*)    All other components within normal limits  COMPREHENSIVE METABOLIC PANEL - Abnormal; Notable for the following:    Glucose, Bld 174 (*)    AST 14 (*)    ALT 11 (*)    All other components within normal limits  URINALYSIS, ROUTINE W REFLEX MICROSCOPIC - Abnormal; Notable for the following:    Color, Urine AMBER (*)    APPearance CLOUDY (*)    Glucose, UA 100 (*)    Hgb urine dipstick LARGE (*)    All other components within normal limits  URINALYSIS, MICROSCOPIC (REFLEX) - Abnormal; Notable for the following:    Bacteria, UA MANY (*)    Squamous Epithelial / LPF 6-30 (*)    All other components within normal limits  CBC    EKG  EKG Interpretation None       Radiology Ct Abdomen Pelvis W Contrast  Result Date: 12/08/2016 CLINICAL DATA:  Abdominal pain for 1 week, also bilateral flank pain. EXAM: CT ABDOMEN AND PELVIS WITH CONTRAST TECHNIQUE: Multidetector CT imaging of the abdomen and pelvis was performed using the standard protocol following bolus administration of intravenous contrast. CONTRAST:  156mL ISOVUE-300 IOPAMIDOL (ISOVUE-300) INJECTION 61% COMPARISON:  CT abdomen dated 08/22/2012. FINDINGS: Lower chest: No acute abnormality. Hepatobiliary: No focal liver abnormality is seen. No gallstones, gallbladder wall thickening, or biliary dilatation. Pancreas: Ill-defined fluid stranding/edema about the pancreatic head. Otherwise unremarkable. Spleen: Normal in size without focal abnormality. Adrenals/Urinary Tract: Adrenal glands appear normal. Kidneys appear normal without mass, stone or hydronephrosis. No ureteral or bladder calculi identified. Bladder is decompressed. Stomach/Bowel: No dilated  large or small bowel loops. Appendix is normal. Ill-defined edema about the proximal duodenum and pancreatic head, with at least mild thickening of the walls of the duodenum. Vascular/Lymphatic: Aortic atherosclerosis. No enlarged lymph nodes seen Reproductive: Multiple partially calcified fibroids within the enlarged uterus, largest fibroid measuring approximately 6.7 cm. Cystic-appearing mass in the posterior left pelvis, measuring 4.4 cm greatest dimension, with CT density measurements suggesting simple or near simple cyst, likely ovarian or paraovarian. Other: No abscess collection seen.  No free intraperitoneal air. Musculoskeletal: Degenerative changes within the lumbar spine, mild to moderate in degree, with associated disc bulges causing mild to moderate central canal stenoses in the lower lumbar spine, and some degree of associated osseous neural foramen narrowings within the mid and lower lumbar spine with possible associated nerve root impingement. No acute or suspicious osseous finding. Superficial soft tissues are unremarkable. IMPRESSION: 1. Mild fluid stranding/edema about the proximal duodenum and pancreatic head, with at least mild associated thickening  of the walls of the proximal duodenum. Findings could be related to duodenitis or ulcer disease of the duodenum. Alternatively, findings could represent acute pancreatitis with secondary reactive thickening of the duodenal walls. Recommend correlation with pancreatic lab values and clinical symptoms. 2. Leiomyomatous uterus. 3. Aortic atherosclerosis. 4. Degenerative changes of the lumbar spine, as detailed above. Electronically Signed   By: Franki Cabot M.D.   On: 12/08/2016 10:27    Procedures Procedures (including critical care time)  Medications Ordered in ED Medications  HYDROmorphone (DILAUDID) injection 1 mg (1 mg Intravenous Given 12/08/16 0934)  ondansetron (ZOFRAN) injection 4 mg (4 mg Intravenous Given 12/08/16 0934)  iopamidol  (ISOVUE-300) 61 % injection 100 mL (100 mLs Intravenous Contrast Given 12/08/16 0957)     Initial Impression / Assessment and Plan / ED Course  I have reviewed the triage vital signs and the nursing notes.  Pertinent labs & imaging results that were available during my care of the patient were reviewed by me and considered in my medical decision making (see chart for details).     Patient seen and examined. Work-up initiated. Medications ordered.   Vital signs reviewed and are as follows: BP (!) 150/86 (BP Location: Right Arm) Comment: Pt hasn't taken BP meds  Pulse 60   Temp 98 F (36.7 C) (Oral)   Resp 20   Ht 5\' 3"  (1.6 m)   Wt (!) 156 kg (343 lb 14.7 oz)   LMP 12/08/2016   SpO2 98%   BMI 60.92 kg/m   9:43 AM patient feeling better after pain medication. Reviewed labs which are reassuring overall. We discussed the benefits and limitations of CT imaging. Patient wishes to proceed with this.  11:18 AM CT findings as above. Will start patient on H2 blocker and PPI. Also, discussed that she will need to follow-up with her primary care physician to see if they want her to continue her injectable diabetes medication. Referral to gastroenterology given, patient will call tomorrow for an appointment.  The patient was urged to return to the Emergency Department immediately with worsening of current symptoms, worsening abdominal pain, persistent vomiting, blood noted in stools, fever, or any other concerns. The patient verbalized understanding.    Final Clinical Impressions(s) / ED Diagnoses   Final diagnoses:  Duodenitis  Epigastric abdominal pain   Patient with epigastric abdominal pain. CT scan shows evidence of duodenitis as well as pancreatitis. Patient does not clinically appear to have severe pancreatitis. She has no vomiting and her lipase is only marginally elevated. She is on injectable diabetes medication which his risk factor for pancreatitis. Will treat duodenitis and  outpatient follow-up with GI and her PCP. Otherwise, she appears very well and is stable for discharge to home.  New Prescriptions New Prescriptions   FAMOTIDINE (PEPCID) 20 MG TABLET    Take 1 tablet (20 mg total) by mouth 2 (two) times daily.   OMEPRAZOLE (PRILOSEC) 20 MG CAPSULE    Take 1 capsule (20 mg total) by mouth daily.     Carlisle Cater, PA-C 12/08/16 1120    Mesner, Corene Cornea, MD 12/08/16 1704
